# Patient Record
Sex: Male | Born: 1990 | Race: White | Hispanic: No | Marital: Single | Smoking: Current every day smoker
Health system: Southern US, Community
[De-identification: ages and names within clinical notes are randomized; demographics above are authoritative.]

## PROBLEM LIST (undated history)

## (undated) HISTORY — PX: APPENDECTOMY: SHX54

---
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
## 2010-10-18 LAB — URINE MICROSCOPIC-ADD ON

## 2010-10-18 LAB — COMPREHENSIVE METABOLIC PANEL
Albumin: 4.1
BUN: 6
CO2: 27
Chloride: 106
Glucose, Bld: 98
Potassium: 3.6
Sodium: 139
Total Bilirubin: 0.7
Total Protein: 6.2

## 2010-10-18 LAB — URINALYSIS, ROUTINE W REFLEX MICROSCOPIC
Ketones, ur: NEGATIVE
Protein, ur: NEGATIVE

## 2010-10-18 LAB — CBC
HCT: 44.2
MCHC: 33.5
MCV: 88.5
Platelets: 198
RDW: 14.5
WBC: 9.7

## 2010-10-18 LAB — RAPID URINE DRUG SCREEN, HOSP PERFORMED
Amphetamines: NOT DETECTED
Barbiturates: NOT DETECTED
Benzodiazepines: POSITIVE — AB
Cocaine: NOT DETECTED
Opiates: NOT DETECTED

## 2010-10-18 LAB — DIFFERENTIAL: Monocytes Relative: 5

## 2010-10-18 LAB — ETHANOL: Alcohol, Ethyl (B): <5
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
## 2013-04-05 ENCOUNTER — Emergency Department (HOSPITAL_BASED_OUTPATIENT_CLINIC_OR_DEPARTMENT_OTHER)
Admission: EM | Admit: 2013-04-05 | Discharge: 2013-04-05 | Disposition: A | Discharge status: Home / Self Care | Payer: Self-pay | Attending: Emergency Medicine | Admitting: Emergency Medicine

## 2013-04-05 ENCOUNTER — Encounter (HOSPITAL_BASED_OUTPATIENT_CLINIC_OR_DEPARTMENT_OTHER): Payer: Self-pay | Admitting: Emergency Medicine

## 2013-04-05 DIAGNOSIS — F17200 Nicotine dependence, unspecified, uncomplicated: Secondary | ICD-10-CM | POA: Insufficient documentation

## 2013-04-05 DIAGNOSIS — R21 Rash and other nonspecific skin eruption: Secondary | ICD-10-CM | POA: Insufficient documentation

## 2013-04-05 DIAGNOSIS — L539 Erythematous condition, unspecified: Secondary | ICD-10-CM | POA: Insufficient documentation

## 2013-04-05 MED ORDER — CLINDAMYCIN HCL 150 MG PO CAPS
300.0000 mg | ORAL_CAPSULE | Freq: Three times a day (TID) | ORAL | Status: AC
Start: 2013-04-05 — End: ?

## 2013-04-05 NOTE — Discharge Instructions (Signed)
Please take all medication as prescribed.  Return if worsening rash, fever, or unable to keep medication down.

## 2013-04-05 NOTE — ED Provider Notes (Signed)
CSN: [id number]     Arrival date & time 04/05/13  1001 History   First MD Initiated Contact with Patient 04/05/13 1020     Chief Complaint  Patient presents with  . Rash     (Consider location/radiation/quality/duration/timing/severity/associated sxs/prior Treatment) HPI 23 year old male treated for scabies 2 weeks ago with continued rash. Some of the areas have surrounding erythema and he reports drainage from these. He continues itching. He is the only one in the house has these symptoms. His father reports that surrounding areas were treated. He denies any fever or chills. He has not had any similar symptoms in the past he has no history of MRSA History reviewed. No pertinent past medical history. Past Surgical History  Procedure Laterality Date  . Appendectomy     No family history on file. History  Substance Use Topics  . Smoking status: Current Every Day Smoker -- 0.50 packs/day  . Smokeless tobacco: Never Used  . Alcohol Use: No    Review of Systems  All other systems reviewed and are negative.      Allergies  Review of patient's allergies indicates no known allergies.  Home Medications  No current outpatient prescriptions on file. BP 132/81  Pulse 97  Temp(Src) 97.4 F (36.3 C) (Oral)  Resp 18  Ht 5\' 6"  (1.676 m)  Wt 130 lb (58.968 kg)  BMI 20.99 kg/m2  SpO2 98% Physical Exam  Nursing note and vitals reviewed. Constitutional: He is oriented to person, place, and time. He appears well-developed and well-nourished.  HENT:  Head: Normocephalic and atraumatic.  Right Ear: External ear normal.  Left Ear: External ear normal.  Nose: Nose normal.  Mouth/Throat: Oropharynx is clear and moist.  Eyes: Conjunctivae and EOM are normal. Pupils are equal, round, and reactive to light.  Neck: Normal range of motion. Neck supple.  Cardiovascular: Normal rate and regular rhythm.   Musculoskeletal: Normal range of motion.  Neurological: He is alert and oriented to  person, place, and time. He has normal reflexes.  Skin: Skin is dry. Rash noted.  Diffuse macular rash with some areas with surrounding erythema, pustules noted but no drainage seen.   Psychiatric: He has a normal mood and affect. His behavior is normal. Judgment and thought content normal.    ED Course  Procedures (including critical care time) Labs Review Labs Reviewed - No data to display Imaging Review No results found.   EKG Interpretation None      MDM   Final diagnoses:  None  Patient with history of scabies now with possible secondary infection.  Plan treatment with clindamycin.    Hilario Quarryanielle S Haydyn Liddell, MD 04/05/13 1027

## 2013-04-05 NOTE — ED Notes (Signed)
MD at bedside. 

## 2013-04-05 NOTE — ED Notes (Signed)
Treated for scabies 2 weeks ago.  Still has rash and some larger red areas now.  Father sts they have another rx for the scabies but he is concerned about the larger areas that look infected. Father sts pt has had scabies since before Christmas.
